# Patient Record
Sex: Male | Born: 1995 | Race: Black or African American | Hispanic: Yes | Marital: Single | Smoking: Current some day smoker
Health system: Southern US, Community
[De-identification: ages and names within clinical notes are randomized; demographics above are authoritative.]

## PROBLEM LIST (undated history)

## (undated) ENCOUNTER — Emergency Department (HOSPITAL_COMMUNITY): Admission: EM | Payer: Self-pay | Source: Home / Self Care

---
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
## 2016-11-17 ENCOUNTER — Encounter (HOSPITAL_COMMUNITY): Payer: Self-pay | Admitting: *Deleted

## 2016-11-17 ENCOUNTER — Emergency Department (HOSPITAL_COMMUNITY): Payer: Self-pay

## 2016-11-17 ENCOUNTER — Emergency Department (HOSPITAL_COMMUNITY)
Admission: EM | Admit: 2016-11-17 | Discharge: 2016-11-17 | Disposition: A | Discharge status: Home / Self Care | Payer: Self-pay | Attending: Emergency Medicine | Admitting: Emergency Medicine

## 2016-11-17 DIAGNOSIS — N50812 Left testicular pain: Secondary | ICD-10-CM | POA: Insufficient documentation

## 2016-11-17 DIAGNOSIS — N50811 Right testicular pain: Secondary | ICD-10-CM | POA: Insufficient documentation

## 2016-11-17 DIAGNOSIS — F17210 Nicotine dependence, cigarettes, uncomplicated: Secondary | ICD-10-CM | POA: Insufficient documentation

## 2016-11-17 DIAGNOSIS — N50819 Testicular pain, unspecified: Secondary | ICD-10-CM

## 2016-11-17 DIAGNOSIS — Z79899 Other long term (current) drug therapy: Secondary | ICD-10-CM | POA: Insufficient documentation

## 2016-11-17 DIAGNOSIS — Z791 Long term (current) use of non-steroidal anti-inflammatories (NSAID): Secondary | ICD-10-CM | POA: Insufficient documentation

## 2016-11-17 LAB — URINALYSIS, ROUTINE W REFLEX MICROSCOPIC
Bilirubin Urine: NEGATIVE
Glucose, UA: NEGATIVE mg/dL
Hgb urine dipstick: NEGATIVE
Ketones, ur: NEGATIVE mg/dL
Leukocytes, UA: NEGATIVE
Nitrite: NEGATIVE
Protein, ur: 30 mg/dL — AB
Specific Gravity, Urine: 1.025 (ref 1.005–1.030)
Squamous Epithelial / LPF: NONE SEEN
pH: 8 (ref 5.0–8.0)

## 2016-11-17 LAB — CBC
HCT: 42 % (ref 39.0–52.0)
Hemoglobin: 14.2 g/dL (ref 13.0–17.0)
MCH: 28.4 pg (ref 26.0–34.0)
MCHC: 33.8 g/dL (ref 30.0–36.0)
MCV: 84 fL (ref 78.0–100.0)
Platelets: 262 10*3/uL (ref 150–400)
RBC: 5 MIL/uL (ref 4.22–5.81)
RDW: 12.6 % (ref 11.5–15.5)
WBC: 4.4 10*3/uL (ref 4.0–10.5)

## 2016-11-17 LAB — COMPREHENSIVE METABOLIC PANEL
ALT: 16 U/L — ABNORMAL LOW (ref 17–63)
AST: 21 U/L (ref 15–41)
Albumin: 4.5 g/dL (ref 3.5–5.0)
Alkaline Phosphatase: 59 U/L (ref 38–126)
Anion gap: 7 (ref 5–15)
BUN: 10 mg/dL (ref 6–20)
CO2: 26 mmol/L (ref 22–32)
Calcium: 9.6 mg/dL (ref 8.9–10.3)
Chloride: 106 mmol/L (ref 101–111)
Creatinine, Ser: 0.85 mg/dL (ref 0.61–1.24)
GFR calc Af Amer: >60 mL/min (ref >60)
GFR calc non Af Amer: >60 mL/min (ref >60)
Glucose, Bld: 96 mg/dL (ref 65–99)
Potassium: 4 mmol/L (ref 3.5–5.1)
Sodium: 139 mmol/L (ref 135–145)
Total Bilirubin: 1.2 mg/dL (ref 0.3–1.2)
Total Protein: 7.8 g/dL (ref 6.5–8.1)

## 2016-11-17 LAB — LIPASE, BLOOD: Lipase: 29 U/L (ref 11–51)

## 2016-11-17 NOTE — ED Notes (Signed)
Patient visualized leaving ER by this RN- pt sts unable to wait and has to leave now. PA made aware

## 2016-11-17 NOTE — ED Notes (Signed)
Pt to US.

## 2016-11-17 NOTE — ED Triage Notes (Signed)
Pt sates intermittent testicular pain, back pain and abdominal pain x 1 week.  States 2 days this week his urine was "purple/red) and the pain was so severe that he vomited.

## 2016-11-17 NOTE — ED Provider Notes (Signed)
[facility]EM[facility]Y DEPT Provider Note   CSN: [id number] Arrival date & time: 11/17/16  1141     History   Chief Complaint Chief Complaint  Patient presents with  . Testicle Pain  . Abdominal Pain    HPI Ladarius Seubert is a 21 y.o. male.  HPI   21 year old male presents today with complaints of testicular pain.  Patient notes that over the last year he has had on and off bladder and testicular pain.  He notes the symptoms come in waves with intense pain over the bladder radiating down into the bilateral testicles.  He notes these last several hours and then resolve on their own without intervention.  Patient notes most recently he had symptoms 6 days ago.  He notes the pain of the bladder with radiation of the testicles, he had dark bloody urine.  Symptoms lasted through the evening and then resolved on their own.  Patient denies any swelling or edema over the testicles or abdomen.  He reports the abdomen and pelvis are nontender to palpation.  He denies any fever or infectious etiology.  Patient notes that he has not had any significant penile discharge or dysuria.  Patient denies any other complaints today.  No significant past medical history.      History reviewed. No pertinent past medical history.  There are no active problems to display for this patient.   History reviewed. No pertinent surgical history.     Home Medications    Prior to Admission medications   Medication Sig Start Date End Date Taking? Authorizing Provider  ibuprofen (ADVIL,MOTRIN) 400 MG tablet Take 1 tablet (400 mg total) by mouth every 6 (six) hours as needed. 05/11/13   Marcellina Millin, MD  mupirocin cream (BACTROBAN) 2 % Apply 1 application topically 2 (two) times daily. X 7 days qs 05/11/13   Marcellina Millin, MD    Family History No family history on file.  Social History Social History  Substance Use Topics  . Smoking status: Current Some Day Smoker  . Smokeless tobacco: Never Used  . Alcohol  use Yes     Comment: occ     Allergies   Patient has no known allergies.   Review of Systems Review of Systems  All other systems reviewed and are negative.    Physical Exam Updated Vital Signs BP 115/82 (BP Location: Left Arm)   Pulse 67   Temp 98.6 F (37 C) (Oral)   Resp 16   Ht 5\' 5"  (1.651 m)   Wt 54.9 kg (121 lb)   SpO2 100%   BMI 20.14 kg/m   Physical Exam  Constitutional: He is oriented to person, place, and time. He appears well-developed and well-nourished.  HENT:  Head: Normocephalic and atraumatic.  Eyes: Pupils are equal, round, and reactive to light. Conjunctivae are normal. Right eye exhibits no discharge. Left eye exhibits no discharge. No scleral icterus.  Neck: Normal range of motion. No JVD present. No tracheal deviation present.  Pulmonary/Chest: Effort normal. No stridor.  Abdominal:  Abdomen nontender to palpation  Genitourinary:  Genitourinary Comments: Bilateral descended testes nontender to palpation no rashes noted  Neurological: He is alert and oriented to person, place, and time. Coordination normal.  Psychiatric: He has a normal mood and affect. His behavior is normal. Judgment and thought content normal.  Nursing note and vitals reviewed.    ED Treatments / Results  Labs (all labs ordered are listed, but only abnormal results are displayed) Labs Reviewed  COMPREHENSIVE METABOLIC  PANEL - Abnormal; Notable for the following:       Result Value   ALT 16 (*)    All other components within normal limits  URINALYSIS, ROUTINE W REFLEX MICROSCOPIC - Abnormal; Notable for the following:    Protein, ur 30 (*)    Bacteria, UA FEW (*)    All other components within normal limits  LIPASE, BLOOD  CBC  GC/CHLAMYDIA PROBE AMP () [facility]    EKG  EKG Interpretation None       Radiology [place] Scrotum  Result Date: 11/17/2016 CLINICAL DATA:  Testicular pain. Hematuria last Thursday. Pain for 1 year. EXAM: SCROTAL ULTRASOUND  DOPPLER ULTRASOUND OF THE TESTICLES TECHNIQUE: Complete ultrasound examination of the testicles, epididymis, and other scrotal structures was performed. Color and spectral Doppler ultrasound were also utilized to evaluate blood flow to the testicles. COMPARISON:  None. FINDINGS: Right testicle Measurements: 36 x 18 x 27 mm. No mass or microlithiasis visualized. Left testicle Measurements: 44 x 20 x 28 mm. Negative for mass or inflammation. Microcalcifications with up to 5 calcifications seen on an individual image. Right epididymis:  Normal in size and appearance. Left epididymis:  Normal in size and appearance. Hydrocele:  None visualized. Varicocele:  None visualized. Pulsed Doppler interrogation of both testes demonstrates normal low resistance arterial and venous waveforms bilaterally. IMPRESSION: 1. No acute finding. 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 Electronically Signed   By: Marnee SpringJonathon  Watts M.D.   On: 11/17/2016 16:14   [place] Art/ven Flow Abd Pelv Doppler  Result Date: 11/17/2016 CLINICAL DATA:  Testicular pain. Hematuria last Thursday. Pain for 1 year. EXAM: SCROTAL ULTRASOUND DOPPLER ULTRASOUND OF THE TESTICLES TECHNIQUE: Complete ultrasound examination of the testicles, epididymis, and other scrotal structures was performed. Color and spectral Doppler ultrasound were also utilized to evaluate blood flow to the testicles. COMPARISON:  None. FINDINGS: Right testicle Measurements: 36 x 18 x 27 mm. No mass or microlithiasis visualized. Left testicle Measurements: 44 x 20 x 28 mm. Negative for mass or inflammation. Microcalcifications with up to 5 calcifications seen on an individual image. Right epididymis:  Normal in size and appearance. Left epididymis:  Normal in size and appearance. Hydrocele:  None visualized. Varicocele:  None visualized. Pulsed Doppler interrogation of both testes demonstrates normal low resistance arterial and venous waveforms bilaterally. IMPRESSION: 1. No acute finding. 2.  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 Electronically Signed   By: Marnee SpringJonathon  Watts M.D.   On: 11/17/2016 16:14    Procedures Procedures (including critical care time)  Medications Ordered in ED Medications - No data to display   Initial Impression / Assessment and Plan / ED Course  I have reviewed the triage vital signs and the nursing notes.  Pertinent labs & imaging results that were available during my care of the patient were reviewed by me and considered in my medical decision making (see chart for details).     Final Clinical Impressions(s) / ED Diagnoses   Final diagnoses:  Testicular pain   Labs: Urinalysis, lipase, CMP, CBC  Imaging: Ultrasound scrotum  Consults:  Therapeutics:  Discharge Meds:   Assessment/Plan: Presents with on and off bladder pain with radiation to his testicles.  Patient has no signs of infectious etiology.  I have very low suspicion for testicular torsion in this patient.  He is asymptomatic now.  His laboratory analysis is unrevealing.  A question if patient is having kidney stones as he did some blood in his urine.  Patient will have  testicular ultrasound here.  After patient getting ultrasound he reported that he would be leaving as he had a good work.  I did not have the results of the ultrasound, I also recommended that he stay for discharge paperwork so that I could refer him to urology.  Patient left prior to discharge paperwork or results of the ultrasound.  Chart review shows that ultrasound shows no significant findings.      New Prescriptions Discharge Medication List as of 11/17/2016  4:16 PM       Rosalio Loud 11/17/16 1939    Raeford Razor, MD 11/27/16 1310

## 2016-11-18 LAB — GC/CHLAMYDIA PROBE AMP (~~LOC~~) NOT AT ARMC
Chlamydia: NEGATIVE
Neisseria Gonorrhea: NEGATIVE

## 2017-01-17 ENCOUNTER — Emergency Department (HOSPITAL_COMMUNITY)
Admission: EM | Admit: 2017-01-17 | Discharge: 2017-01-17 | Disposition: A | Discharge status: Home / Self Care | Payer: Self-pay | Attending: Emergency Medicine | Admitting: Emergency Medicine

## 2017-01-17 ENCOUNTER — Encounter (HOSPITAL_COMMUNITY): Payer: Self-pay | Admitting: *Deleted

## 2017-01-17 DIAGNOSIS — K130 Diseases of lips: Secondary | ICD-10-CM | POA: Insufficient documentation

## 2017-01-17 DIAGNOSIS — L03211 Cellulitis of face: Secondary | ICD-10-CM

## 2017-01-17 DIAGNOSIS — R220 Localized swelling, mass and lump, head: Secondary | ICD-10-CM

## 2017-01-17 DIAGNOSIS — F17200 Nicotine dependence, unspecified, uncomplicated: Secondary | ICD-10-CM | POA: Insufficient documentation

## 2017-01-17 MED ORDER — CLINDAMYCIN HCL 150 MG PO CAPS
450.0000 mg | ORAL_CAPSULE | Freq: Once | ORAL | Status: AC
  Administered 2017-01-17: 450 mg via ORAL
  Filled 2017-01-17: qty 3

## 2017-01-17 MED ORDER — CLINDAMYCIN HCL 150 MG PO CAPS: 450.0000 mg | ORAL_CAPSULE | Freq: Three times a day (TID) | ORAL | 0 refills | Status: AC

## 2017-01-17 MED ORDER — TETANUS-DIPHTH-ACELL PERTUSSIS 5-2.5-18.5 LF-MCG/0.5 IM SUSP
0.5000 mL | Freq: Once | INTRAMUSCULAR | Status: AC
  Administered 2017-01-17: 0.5 mL via INTRAMUSCULAR
  Filled 2017-01-17: qty 0.5

## 2017-01-17 MED ORDER — PENTAFLUOROPROP-TETRAFLUOROETH EX AERO
INHALATION_SPRAY | Freq: Once | CUTANEOUS | Status: AC
  Administered 2017-01-17: 21:00:00 via TOPICAL
  Filled 2017-01-17: qty 103.5

## 2017-01-17 NOTE — ED Triage Notes (Signed)
Pt reports waking up yesterday with upper lip swelling with bumps on it. Denies injury, denies taking any prescription meds and minimal relief with benadryl pta. Denies pain. Airway intact.

## 2017-01-17 NOTE — Discharge Instructions (Signed)

## 2017-01-17 NOTE — ED Provider Notes (Signed)
[facility] Provider Note   CSN: [id number] Arrival date & time: 01/17/17  1752     History   Chief Complaint Chief Complaint  Patient presents with  . Oral Swelling    HPI Vincent Mullins is a 21 y.o. male who presents for evaluation of lip swelling.  He reports that yesterday he woke up with lip swelling and it has been gradually getting worse.  He says that shortly before he noticed the lip swelling he ate at [employer] but did not eat any other new foods.  He did take Benadryl yesterday, however it did not change his symptoms. No fevers or chills, no SOB.  He has no personal or family history of angioedema.  Does not take any medications, no ACE-I use. He has been able to eat and drink ok since this started.    He says he has two small bumps on his upper lip that when he squeezed he had pus come out.  He reports that after he attempted to squeeze this area his lip began swelling significantly.  HPI  History reviewed. No pertinent past medical history.  There are no active problems to display for this patient.   History reviewed. No pertinent surgical history.     Home Medications    Prior to Admission medications   Medication Sig Start Date End Date Taking? Authorizing Provider  clindamycin (CLEOCIN) 150 MG capsule Take 3 capsules (450 mg total) by mouth 3 (three) times daily. 01/17/17 01/24/17  Cristina Gong, PA-C  ibuprofen (ADVIL,MOTRIN) 400 MG tablet Take 1 tablet (400 mg total) by mouth every 6 (six) hours as needed. 05/11/13   Marcellina Millin, MD  mupirocin cream (BACTROBAN) 2 % Apply 1 application topically 2 (two) times daily. X 7 days qs 05/11/13   Marcellina Millin, MD    Family History History reviewed. No pertinent family history.  Social History Social History  Substance Use Topics  . Smoking status: Current Some Day Smoker  . Smokeless tobacco: Never Used  . Alcohol use Yes     Comment: occ     Allergies   Patient has no known  allergies.   Review of Systems Review of Systems  Constitutional: Negative for chills, diaphoresis and fever.  HENT: Positive for facial swelling.   Respiratory: Negative for shortness of breath.   Gastrointestinal: Negative for abdominal pain, nausea and vomiting.  Skin: Positive for wound. Negative for color change and rash.  Neurological: Negative for headaches.     Physical Exam Updated Vital Signs BP 113/66   Pulse 86   Temp 98.7 F (37.1 C) (Oral)   Resp 16   SpO2 100%   Physical Exam  Constitutional: He appears well-developed and well-nourished.  HENT:  Head: Normocephalic.  Right Ear: External ear normal.  Left Ear: External ear normal.  Nose: Nose normal.  Mouth/Throat: Oropharynx is clear and moist. No oropharyngeal exudate.  There is obvious swelling to patient's left superior lip. There are 2 small papules with mild surrounding induration.  The remainder of the lip feels generally fluctuant, highly suspicious for abscess.  Neck: Normal range of motion. Neck supple. No JVD present. No tracheal deviation present.  Cardiovascular: Normal rate.   Pulmonary/Chest: Effort normal and breath sounds normal. No stridor.  Lymphadenopathy:    He has no cervical adenopathy.  Neurological: He is alert.  Skin: Skin is warm and dry. He is not diaphoretic.  There are 2 small bumps along the lateral aspect of patient's superior left lip.  Nursing note and vitals reviewed.      ED Treatments / Results  Labs (all labs ordered are listed, but only abnormal results are displayed) Labs Reviewed - No data to display  EKG  EKG Interpretation None       Radiology No results found.  Procedures .Marland KitchenIncision and Drainage Date/Time: 01/18/2017 1:31 AM Performed by: Cristina Gong Authorized by: Cristina Gong   Consent:    Consent obtained:  Verbal   Consent given by:  Patient   Risks discussed:  Bleeding, incomplete drainage, infection, pain and damage to  other organs   Alternatives discussed:  No treatment, referral and alternative treatment Location:    Type:  Abscess   Location:  Head   Head/neck location: Left superior lip. Pre-procedure details:    Skin preparation:  Betadine Anesthesia (see MAR for exact dosages):    Anesthesia method:  Topical application   Topical anesthetic:  Benzocaine gel (Gebauers spray) Procedure type:    Complexity:  Simple Procedure details:    Needle aspiration: yes     Needle gauge: 21. Post-procedure details:    Patient tolerance of procedure:  Tolerated with difficulty Comments:     Attempted drainage of abscess in lip. Patient did not tolerate the procedure well. Patient was given the option for injection local anesthetic, and declined.  Needle aspiration was attempted, however no return of material.  Patient refused second attempt.   (including critical care time)  Medications Ordered in ED Medications  pentafluoroprop-tetrafluoroeth (GEBAUERS) aerosol ( Topical Given by Other 01/17/17 2057)  Tdap (BOOSTRIX) injection 0.5 mL (0.5 mLs Intramuscular Given 01/17/17 2052)  clindamycin (CLEOCIN) capsule 450 mg (450 mg Oral Given 01/17/17 2051)     Initial Impression / Assessment and Plan / ED Course  I have reviewed the triage vital signs and the nursing notes.  Pertinent labs & imaging results that were available during my care of the patient were reviewed by me and considered in my medical decision making (see chart for details).    Vincent Mullins presents for evaluation of swelling to his left superior lip. Based on history, papules draining pus, and worsening after he attempted to squeeze pus out of the papules I am highly suspicious that this represents an infectious process, rather than a allergic one.  He is not having any other symptoms at this time.  His upper lip is fluctuant, consistent with abscess. Needle aspiration was attempted, which patient tolerated poorly and therefore I was unable  to adequately attempt to drain the area.  Will start patient on antibiotics.    This patient was seen as a shared visit with Dr. Clarene Duke.  Patient was given strict return precautions, and stated his understanding. He was instructed to follow-up with ear nose and throat if antibiotics do not resolve his swelling.     At this time there does not appear to be any evidence of an acute emergency medical condition and the patient appears stable for discharge with appropriate outpatient follow up.Diagnosis was discussed with patient who verbalizes understanding and is agreeable to discharge.  Final Clinical Impressions(s) / ED Diagnoses   Final diagnoses:  Lip swelling  Cellulitis of face    New Prescriptions Discharge Medication List as of 01/17/2017  8:50 PM    START taking these medications   Details  clindamycin (CLEOCIN) 150 MG capsule Take 3 capsules (450 mg total) by mouth 3 (three) times daily., Starting Mon 01/17/2017, Until Mon 01/24/2017, Print  Cristina Gong, PA-C 01/18/17 4540    Little, Ambrose Finland, MD 01/20/17 [phone]

## 2017-01-17 NOTE — ED Notes (Signed)
Gebauers spray to provider

## 2017-05-30 ENCOUNTER — Other Ambulatory Visit: Payer: Self-pay

## 2017-09-18 IMAGING — US US SCROTUM
1 series · 14 of 25 positions shown · non-contrast
Comparison: None.

CLINICAL DATA: Testicular pain. Hematuria [REDACTED]. Pain for 1
year.

EXAM:
SCROTAL ULTRASOUND
DOPPLER ULTRASOUND OF THE TESTICLES
TECHNIQUE: Complete ultrasound examination of the testicles, epididymis, and
other scrotal structures was performed. Color and spectral Doppler
ultrasound were also utilized to evaluate blood flow to the
testicles.

[Series 1: us scrotum · 0.06mm/px · 14 of 62 slices shown]
[im 1/62]
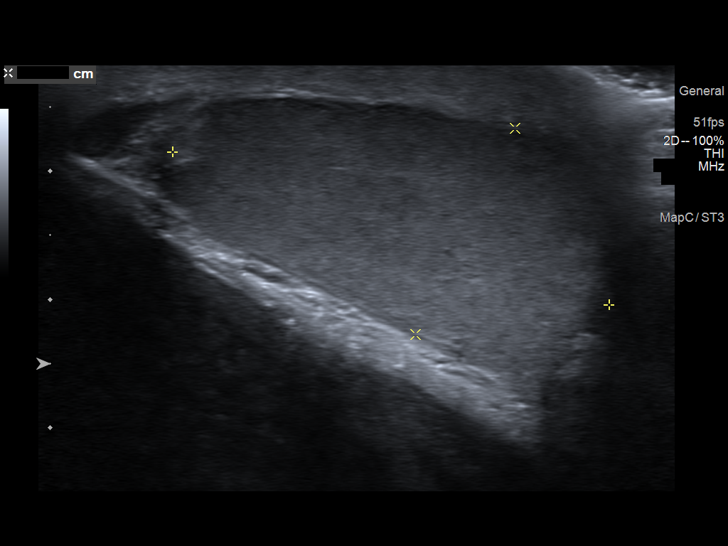
[im 6/62]
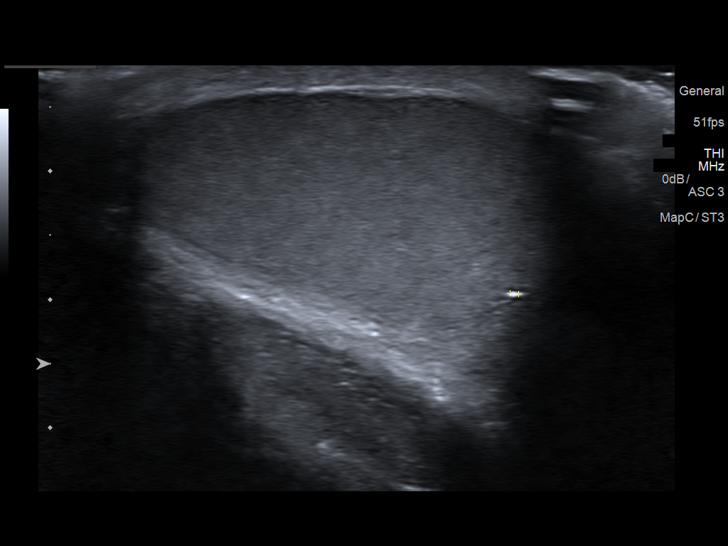
[im 11/62]
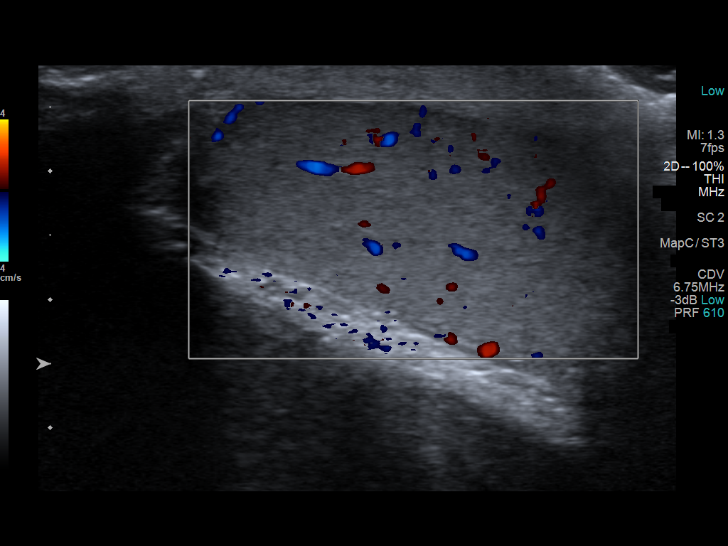
[im 16/62]
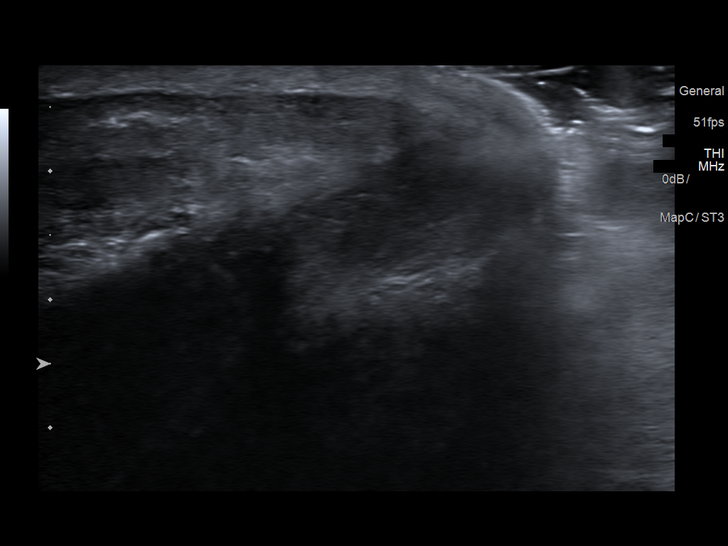
[im 21/62]
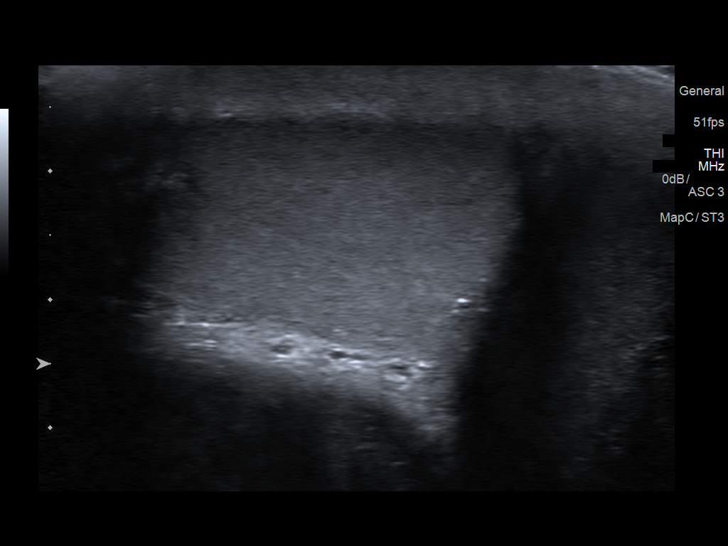
[im 23/62]
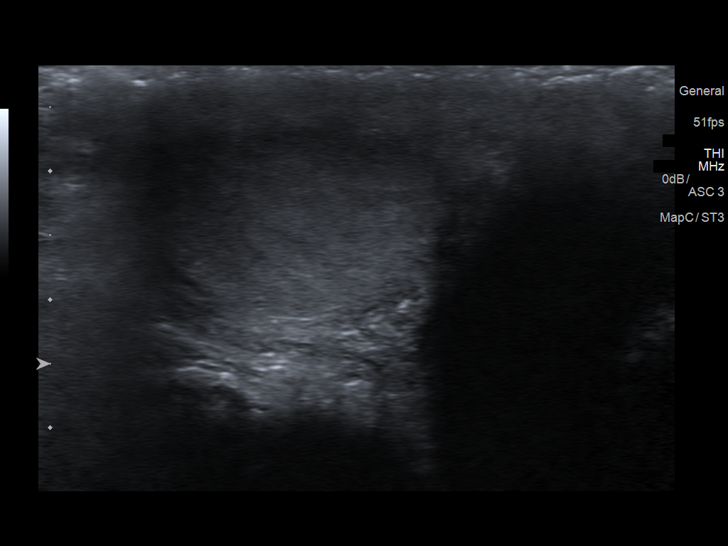
[im 28/62]
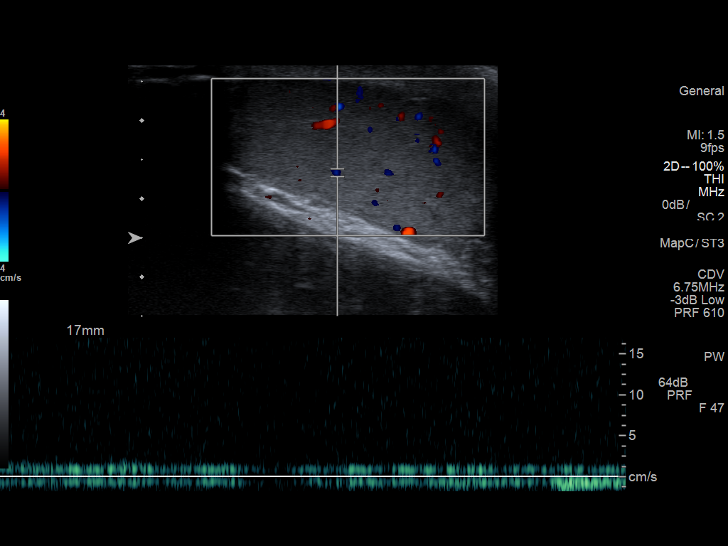
[im 34/62]
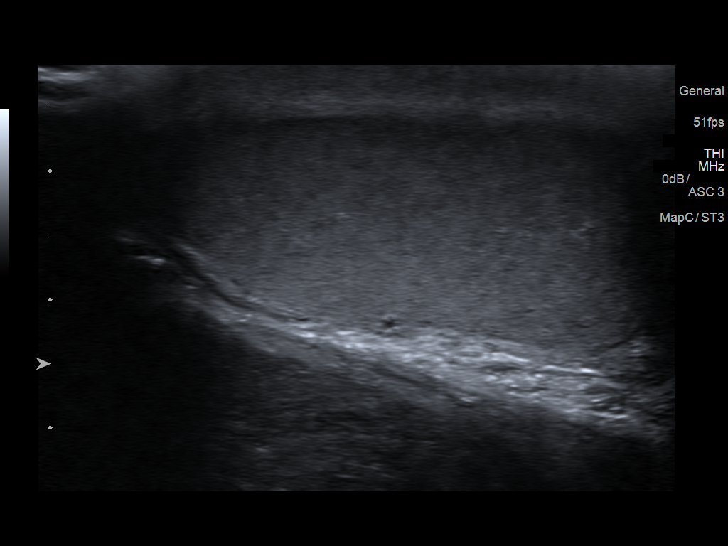
[im 39/62]
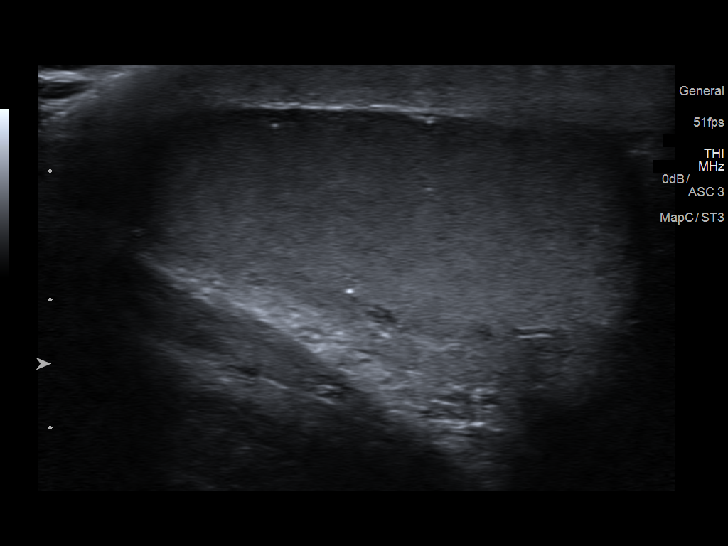
[im 41/62]
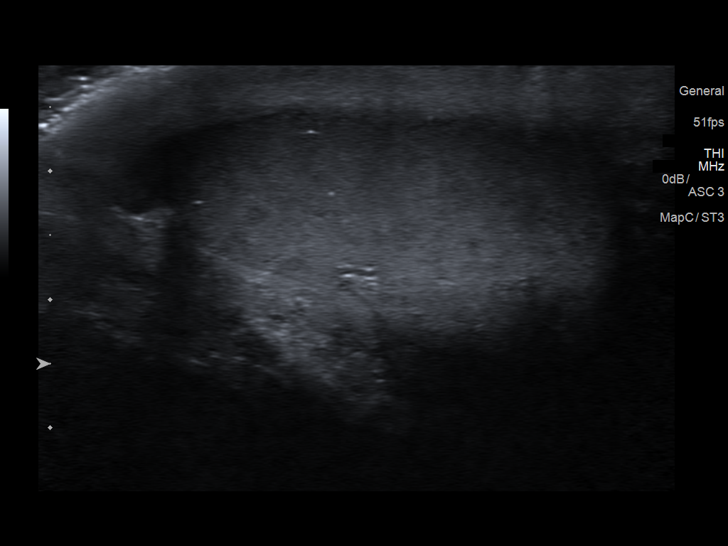
[im 46/62]
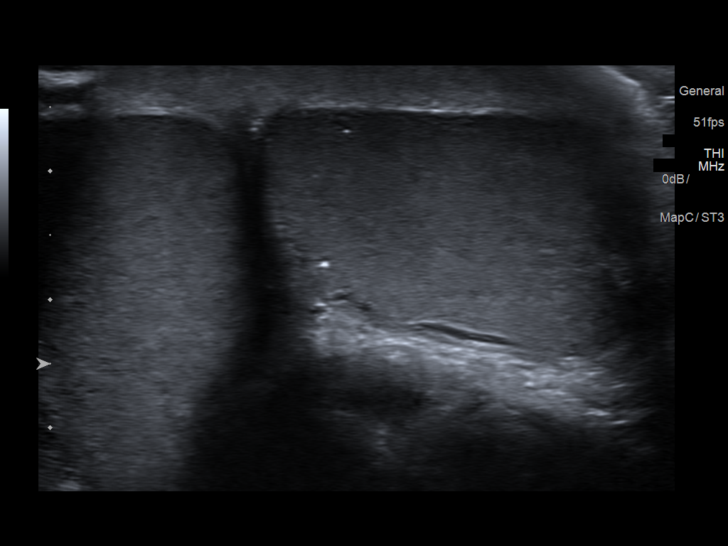
[im 51/62]
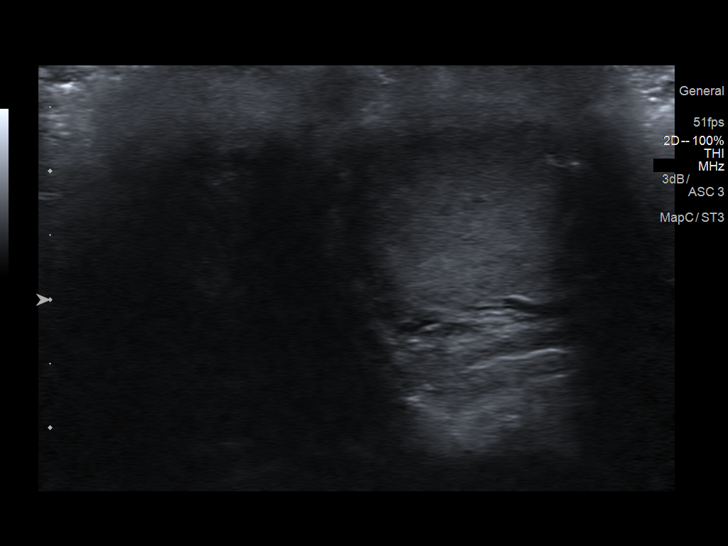
[im 56/62]
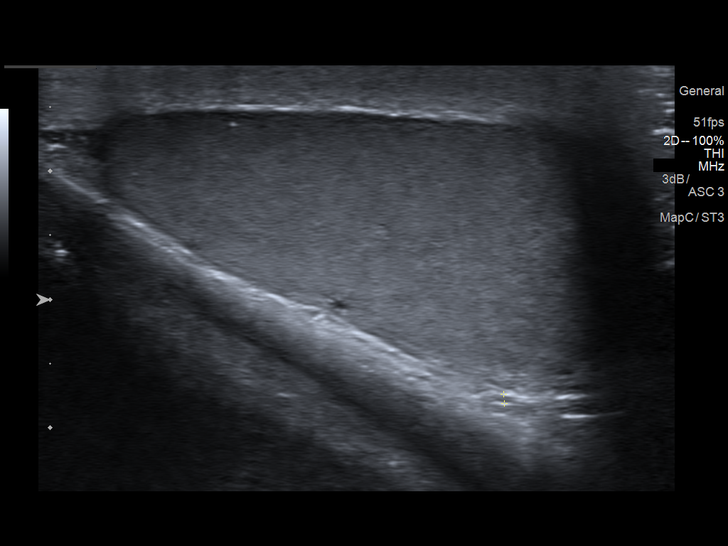
[im 62/62]
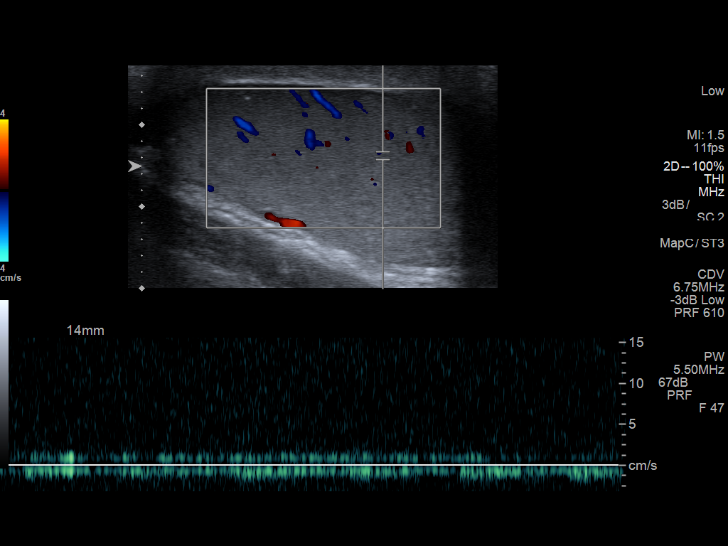

[14 of 25 positions shown; findings below may reference images not displayed]

FINDINGS: Right testicle

Measurements: 36 x 18 x 27 mm. No mass or microlithiasis visualized.

Left testicle

Measurements: 44 x 20 x 28 mm. Negative for mass or inflammation.
Microcalcifications with up to 5 calcifications seen on an
individual image.

Right epididymis:  Normal in size and appearance.

Left epididymis:  Normal in size and appearance.

Hydrocele:  None visualized.

Varicocele:  None visualized.

Pulsed Doppler interrogation of both testes demonstrates normal low
resistance arterial and venous waveforms bilaterally.
IMPRESSION: 1. No acute finding.
2. Testicular microlithiasis on the left.

## 2019-07-03 ENCOUNTER — Other Ambulatory Visit: Payer: Self-pay

## 2019-07-03 ENCOUNTER — Ambulatory Visit (HOSPITAL_COMMUNITY)
Admission: EM | Admit: 2019-07-03 | Discharge: 2019-07-03 | Disposition: A | Discharge status: Home / Self Care | Payer: HRSA Program | Attending: Physician Assistant | Admitting: Physician Assistant

## 2019-07-03 ENCOUNTER — Encounter (HOSPITAL_COMMUNITY): Payer: Self-pay

## 2019-07-03 DIAGNOSIS — R519 Headache, unspecified: Secondary | ICD-10-CM

## 2019-07-03 DIAGNOSIS — Z20822 Contact with and (suspected) exposure to covid-19: Secondary | ICD-10-CM | POA: Diagnosis present

## 2019-07-03 DIAGNOSIS — J111 Influenza due to unidentified influenza virus with other respiratory manifestations: Secondary | ICD-10-CM | POA: Diagnosis present

## 2019-07-03 DIAGNOSIS — R509 Fever, unspecified: Secondary | ICD-10-CM

## 2019-07-03 LAB — POC SARS CORONAVIRUS 2 AG: SARS Coronavirus 2 Ag: NEGATIVE

## 2019-07-03 LAB — POC SARS CORONAVIRUS 2 AG -  ED: SARS Coronavirus 2 Ag: NEGATIVE

## 2019-07-03 MED ORDER — IBUPROFEN 800 MG PO TABS
800.0000 mg | ORAL_TABLET | Freq: Once | ORAL | Status: AC
  Administered 2019-07-03: 20:00:00 800 mg via ORAL

## 2019-07-03 MED ORDER — ONDANSETRON HCL 4 MG PO TABS: 4.0000 mg | ORAL_TABLET | Freq: Three times a day (TID) | ORAL | 0 refills | Status: DC | PRN

## 2019-07-03 MED ORDER — IBUPROFEN 800 MG PO TABS
ORAL_TABLET | ORAL | Status: AC
  Filled 2019-07-03: qty 1

## 2019-07-03 MED ORDER — METOCLOPRAMIDE HCL 5 MG/ML IJ SOLN
INTRAMUSCULAR | Status: AC
  Filled 2019-07-03: qty 2

## 2019-07-03 MED ORDER — KETOROLAC TROMETHAMINE 60 MG/2ML IM SOLN
INTRAMUSCULAR | Status: AC
  Filled 2019-07-03: qty 2

## 2019-07-03 MED ORDER — METOCLOPRAMIDE HCL 5 MG/ML IJ SOLN: 5.0000 mg | Freq: Once | INTRAMUSCULAR | Status: DC

## 2019-07-03 MED ORDER — KETOROLAC TROMETHAMINE 60 MG/2ML IM SOLN: 60.0000 mg | Freq: Once | INTRAMUSCULAR | Status: DC

## 2019-07-03 NOTE — Discharge Instructions (Addendum)
I want you to utilize 2 extra strength Tylenol and 2 regular strength ibuprofen every 8 hours for your headache.  You may also take the Zofran for nausea up to every 8 hours  If your headache is severe again tomorrow I want you to go to the emergency room to be further evaluated.   If you become very lethargic or very sleepy I want you to go to the emergency department.   If your fever continues to rise I would also like for you to go to emergency department  If your Covid-19 test is positive, you will receive a phone call from [facility] regarding your results. Negative test results are not called. Both positive and negative results area always visible on MyChart. If you do not have a MyChart account, sign up instructions are in your discharge papers.   Persons who are directed to care for themselves at home may discontinue isolation under the following conditions:   At least 10 days have passed since symptom onset and  At least 24 hours have passed without running a fever (this means without the use of fever-reducing medications) and  Other symptoms have improved.  Persons infected with COVID-19 who never develop symptoms may discontinue isolation and other precautions 10 days after the date of their first positive COVID-19 test.

## 2019-07-03 NOTE — ED Provider Notes (Addendum)
[facility]    CSN: [id number] Arrival date & time: 07/03/19  1858      History   Chief Complaint Chief Complaint  Patient presents with  . Headache  . Nasal Congestion    HPI Vincent Mullins is a 24 y.o. male.   Patient presents urgent care today for complaint of headache.  He reports the headache started Saturday and has been present since.  He reports has been 10 out of 10 at times however Tylenol brings the pain down to a 7 out of 10 and is more tolerable.  He reports an episode of nosebleed on Saturday that he was able to stop.  He does report a history of having nosebleeds.  He denies a history of migraine or other headache.  He reports that lights on the street make his headache worse but denies the lights in the clinic today make his headache worse.  He has had some nausea associated with this headache but has not vomited.  Reports last week he had symptoms of cough, nasal congestion and sore throat but those mostly resolved into the weekend.  He is also had intermittent loose stools.  He is unsure whether this is related to food or sickness.  He has had chills at night and is woken up with sweats.  He denies having a fever previously and has had a temperature taken at work.  He denies skin rash.  He is also reported some sweaty palms.  Denies shortness of breath or chest pain.   His family members recently tested for Covid and were negative.  They were not sick though.   On reevaluation patient states that his headache is it is 0 out of 10 currently.  And he does state that" it comes and goes like this".      History reviewed. No pertinent past medical history.  There are no problems to display for this patient.   History reviewed. No pertinent surgical history.     Home Medications    Prior to Admission medications   Medication Sig Start Date End Date Taking? Authorizing Provider  acetaminophen (TYLENOL) 325 MG tablet Take 650 mg by mouth every 6 (six)  hours as needed.   Yes [provider]  ibuprofen (ADVIL,MOTRIN) 400 MG tablet Take 1 tablet (400 mg total) by mouth every 6 (six) hours as needed. 05/11/13  Yes Marcellina Millin, MD  mupirocin cream (BACTROBAN) 2 % Apply 1 application topically 2 (two) times daily. X 7 days qs 05/11/13   Marcellina Millin, MD  ondansetron (ZOFRAN) 4 MG tablet Take 1 tablet (4 mg total) by mouth every 8 (eight) hours as needed for nausea or vomiting. 07/03/19   Zyan Coby, Veryl Speak, PA-C    Family History Family History  Problem Relation Age of Onset  . Renal Disease Mother   . Healthy Father     Social History Social History   Tobacco Use  . Smoking status: Current Some Day Smoker  . Smokeless tobacco: Never Used  Substance Use Topics  . Alcohol use: Yes    Comment: occ  . Drug use: Yes    Types: Marijuana    Comment: occ     Allergies   Shrimp [shellfish allergy]   Review of Systems Review of Systems  Constitutional: Positive for appetite change, chills and diaphoresis. Negative for fever.  HENT: Positive for congestion and sore throat. Negative for ear pain, sinus pressure and sinus pain.   Eyes: Positive for photophobia. Negative for  pain and visual disturbance.  Respiratory: Positive for cough. Negative for shortness of breath.   Cardiovascular: Negative for chest pain and palpitations.  Gastrointestinal: Positive for diarrhea and nausea. Negative for abdominal pain and vomiting.  Genitourinary: Negative for dysuria and hematuria.  Musculoskeletal: Positive for myalgias and neck stiffness. Negative for arthralgias, back pain and neck pain.  Skin: Negative for color change and rash.  Neurological: Positive for headaches. Negative for dizziness, seizures, syncope and weakness.  All other systems reviewed and are negative.    Physical Exam Triage Vital Signs ED Triage Vitals  Enc Vitals Group     BP 07/03/19 1923 112/71     Pulse Rate 07/03/19 1923 67     Resp 07/03/19 1923 18     Temp  07/03/19 1923 100 F (37.8 C)     Temp Source 07/03/19 1923 Oral     SpO2 07/03/19 1923 100 %     Weight --      Height --      Head Circumference --      Peak Flow --      Pain Score 07/03/19 1918 8     Pain Loc --      Pain Edu? --      Excl. in GC? --    No data found.  Updated Vital Signs BP 112/71 (BP Location: Left Arm)   Pulse 67   Temp 100 F (37.8 C) (Oral)   Resp 18   SpO2 100%   Visual Acuity Right Eye Distance:   Left Eye Distance:   Bilateral Distance:    Right Eye Near:   Left Eye Near:    Bilateral Near:     Physical Exam Vitals and nursing note reviewed.  Constitutional:      General: He is not in acute distress.    Appearance: He is well-developed. He is ill-appearing.  HENT:     Head: Normocephalic and atraumatic.     Mouth/Throat:     Mouth: Mucous membranes are moist.     Pharynx: Oropharynx is clear.  Eyes:     General: No visual field deficit or scleral icterus.    Extraocular Movements: Extraocular movements intact.     Right eye: Normal extraocular motion.     Left eye: Normal extraocular motion.     Conjunctiva/sclera: Conjunctivae normal.     Pupils: Pupils are equal, round, and reactive to light. Pupils are equal.  Neck:     Comments: Patient reports some neck stiffness with chin to chest.  When patient was placed in supine he reported severe headache with the position change.  He also reports headache got worse when he bent his neck forward with chin to chest.  Of note after returning to room following physical exam patient reports no headache at this time. Cardiovascular:     Rate and Rhythm: Normal rate and regular rhythm.     Heart sounds: No murmur.  Pulmonary:     Effort: Pulmonary effort is normal. No respiratory distress.     Breath sounds: Normal breath sounds.  Abdominal:     Palpations: Abdomen is soft.     Tenderness: There is no abdominal tenderness.  Musculoskeletal:     Cervical back: Neck supple. No rigidity.    Lymphadenopathy:     Cervical: No cervical adenopathy.  Skin:    General: Skin is warm and dry.     Capillary Refill: Capillary refill takes less than 2 seconds.     Findings:  No rash.  Neurological:     Mental Status: He is alert and oriented to person, place, and time.     Cranial Nerves: No cranial nerve deficit.     Sensory: No sensory deficit.     Motor: No weakness.     Deep Tendon Reflexes: Reflexes normal.  Psychiatric:        Mood and Affect: Mood normal.        Speech: Speech normal.        Behavior: Behavior normal.      UC Treatments / Results  Labs (all labs ordered are listed, but only abnormal results are displayed) Labs Reviewed  NOVEL CORONAVIRUS, NAA (HOSP ORDER, SEND-OUT TO REF LAB; TAT 18-24 HRS)  POC SARS CORONAVIRUS 2 AG -  ED  POC SARS CORONAVIRUS 2 AG    EKG   Radiology No results found.  Procedures Procedures (including critical care time)  Medications Ordered in UC Medications  ibuprofen (ADVIL) tablet 800 mg (800 mg Oral Given 07/03/19 2015)    Initial Impression / Assessment and Plan / UC Course  I have reviewed the triage vital signs and the nursing notes.  Pertinent labs & imaging results that were available during my care of the patient were reviewed by me and considered in my medical decision making (see chart for details).     #Influenza-like illness Patient is 24 year old male presenting with acute onset of headache with fever and chills.  Given his clinical history there is some concern whether this may be Covid versus influenza.  Rapid Covid was negative in clinic Covid PCR was sent.  Patient is not had known sick contacts and family members have tested negative for Covid.  No influenza testing was conducted as he is outside the window for appropriate treatment.  Headache let off during clinic, and was given 800 mg ibuprofen prior to discharge.  There was some initial concern of meningitis been given, complete resolution of  headache this is less likely.  Instructed patient that if he has sudden return of headache similar to this I would like for him to get emergency department for evaluation. -Emergency department precautions were discussed patient understands and is amenable to plan -Zofran was sent for nausea related to headaches  Final Clinical Impressions(s) / UC Diagnoses   Final diagnoses:  Influenza-like illness  Encounter for laboratory testing for COVID-19 virus     Discharge Instructions     I want you to utilize 2 extra strength Tylenol and 2 regular strength ibuprofen every 8 hours for your headache.  You may also take the Zofran for nausea up to every 8 hours  If your headache is severe again tomorrow I want you to go to the emergency room to be further evaluated.   If you become very lethargic or very sleepy I want you to go to the emergency department.   If your fever continues to rise I would also like for you to go to emergency department  If your Covid-19 test is positive, you will receive a phone call from [facility] regarding your results. Negative test results are not called. Both positive and negative results area always visible on MyChart. If you do not have a MyChart account, sign up instructions are in your discharge papers.   Persons who are directed to care for themselves at home may discontinue isolation under the following conditions:  . At least 10 days have passed since symptom onset and . At least 24 hours have passed  without running a fever (this means without the use of fever-reducing medications) and . Other symptoms have improved.  Persons infected with COVID-19 who never develop symptoms may discontinue isolation and other precautions 10 days after the date of their first positive COVID-19 test.     ED Prescriptions    Medication Sig Dispense Auth. Provider   ondansetron (ZOFRAN) 4 MG tablet  (Status: Discontinued) Take 1 tablet (4 mg total) by mouth every 8  (eight) hours as needed for nausea or vomiting. 4 tablet Deo Mehringer, Veryl Speak, PA-C   ondansetron (ZOFRAN) 4 MG tablet Take 1 tablet (4 mg total) by mouth every 8 (eight) hours as needed for nausea or vomiting. 4 tablet Neddie Steedman, Veryl Speak, PA-C     PDMP not reviewed this encounter.   Hermelinda Medicus, PA-C 07/03/19 2031    Adilyn Humes, Veryl Speak, PA-C 07/03/19 2032

## 2019-07-03 NOTE — ED Triage Notes (Signed)
Pt c/o HA, sinus congestion, general weakness, abd pain, diarrhea, and chills since Saturday since Saturday. Also reports nausea today, but resolved at present.  Reports had sore throat, cough last week, which resolved.  Took 1000mg  tylenol at approx 1800 today.

## 2019-07-04 ENCOUNTER — Emergency Department (HOSPITAL_COMMUNITY)
Admission: EM | Admit: 2019-07-04 | Discharge: 2019-07-04 | Disposition: A | Discharge status: Home / Self Care | Payer: Self-pay | Attending: Emergency Medicine | Admitting: Emergency Medicine

## 2019-07-04 ENCOUNTER — Other Ambulatory Visit: Payer: Self-pay

## 2019-07-04 ENCOUNTER — Encounter (HOSPITAL_COMMUNITY): Payer: Self-pay

## 2019-07-04 DIAGNOSIS — R519 Headache, unspecified: Secondary | ICD-10-CM | POA: Insufficient documentation

## 2019-07-04 DIAGNOSIS — Z5321 Procedure and treatment not carried out due to patient leaving prior to being seen by health care provider: Secondary | ICD-10-CM | POA: Insufficient documentation

## 2019-07-04 NOTE — ED Notes (Signed)
Called pt x2, no response. 

## 2019-07-04 NOTE — ED Triage Notes (Signed)
Pt reports headache for the past week, seen at [facility] yesterday, had negative COVID test. Sent here for further eval of headache. Pt a.o, nad noted

## 2019-07-05 LAB — NOVEL CORONAVIRUS, NAA (HOSP ORDER, SEND-OUT TO REF LAB; TAT 18-24 HRS): SARS-CoV-2, NAA: NOT DETECTED

## 2019-07-06 ENCOUNTER — Other Ambulatory Visit: Payer: Self-pay

## 2019-07-06 ENCOUNTER — Emergency Department (HOSPITAL_COMMUNITY)
Admission: EM | Admit: 2019-07-06 | Discharge: 2019-07-06 | Disposition: A | Discharge status: Home / Self Care | Payer: Self-pay | Attending: Emergency Medicine | Admitting: Emergency Medicine

## 2019-07-06 ENCOUNTER — Encounter (HOSPITAL_COMMUNITY): Payer: Self-pay | Admitting: *Deleted

## 2019-07-06 DIAGNOSIS — Z720 Tobacco use: Secondary | ICD-10-CM | POA: Insufficient documentation

## 2019-07-06 DIAGNOSIS — R519 Headache, unspecified: Secondary | ICD-10-CM

## 2019-07-06 NOTE — Discharge Instructions (Addendum)
Schedule to have your vision checked

## 2019-07-06 NOTE — ED Provider Notes (Signed)
[facility] EMERGENCY DEPARTMENT Provider Note   CSN: [id number] Arrival date & time: 07/06/19  6546     History Chief Complaint  Patient presents with  . Headache    Vincent Mullins is a 24 y.o. male.  The history is provided by the patient. No language interpreter was used.  Headache Pain location:  Generalized Quality:  Unable to specify Radiates to:  Does not radiate Onset quality:  Gradual Duration:  5 days Chronicity:  New Similar to prior headaches: yes   Relieved by:  NSAIDs Worsened by:  Nothing Ineffective treatments:  None tried Associated symptoms: ear pain   Associated symptoms: no neck stiffness   Risk factors: no anger and no family hx of SAH    Vision seems blurry at night under street lights    History reviewed. No pertinent past medical history.  There are no problems to display for this patient.   History reviewed. No pertinent surgical history.     Family History  Problem Relation Age of Onset  . Renal Disease Mother   . Healthy Father     Social History   Tobacco Use  . Smoking status: Current Some Day Smoker  . Smokeless tobacco: Never Used  Substance Use Topics  . Alcohol use: Yes    Comment: occ  . Drug use: Yes    Types: Marijuana    Comment: occ    Home Medications Prior to Admission medications   Medication Sig Start Date End Date Taking? Authorizing Provider  acetaminophen (TYLENOL) 325 MG tablet Take 650 mg by mouth every 6 (six) hours as needed.    [provider]  ibuprofen (ADVIL,MOTRIN) 400 MG tablet Take 1 tablet (400 mg total) by mouth every 6 (six) hours as needed. 05/11/13   Marcellina Millin, MD  mupirocin cream (BACTROBAN) 2 % Apply 1 application topically 2 (two) times daily. X 7 days qs 05/11/13   Marcellina Millin, MD  ondansetron (ZOFRAN) 4 MG tablet Take 1 tablet (4 mg total) by mouth every 8 (eight) hours as needed for nausea or vomiting. 07/03/19   Darr, Veryl Speak, PA-C    Allergies      Shrimp [shellfish allergy]  Review of Systems   Review of Systems  HENT: Positive for ear pain.   Musculoskeletal: Negative for neck stiffness.  Neurological: Positive for headaches.  All other systems reviewed and are negative.   Physical Exam Updated Vital Signs BP 110/66 (BP Location: Right Arm)   Pulse (!) 55   Temp 98 F (36.7 C) (Oral)   Resp 16   Ht 5\' 6"  (1.676 m)   Wt 59 kg   SpO2 100%   BMI 20.98 kg/m   Physical Exam Vitals and nursing note reviewed.  Constitutional:      Appearance: He is well-developed.  HENT:     Head: Normocephalic and atraumatic.  Eyes:     Conjunctiva/sclera: Conjunctivae normal.  Cardiovascular:     Rate and Rhythm: Normal rate and regular rhythm.     Heart sounds: No murmur.  Pulmonary:     Effort: Pulmonary effort is normal. No respiratory distress.     Breath sounds: Normal breath sounds.  Abdominal:     Tenderness: There is no abdominal tenderness.  Musculoskeletal:     Cervical back: Neck supple.  Skin:    General: Skin is warm and dry.  Neurological:     Mental Status: He is alert and oriented to person, place, and time.  Cranial Nerves: No cranial nerve deficit.     Sensory: No sensory deficit.  Psychiatric:        Mood and Affect: Mood normal.     ED Results / Procedures / Treatments   Labs (all labs ordered are listed, but only abnormal results are displayed) Labs Reviewed - No data to display  EKG None  Radiology No results found.  Procedures Procedures (including critical care time)  Medications Ordered in ED Medications - No data to display  ED Course  I have reviewed the triage vital signs and the nursing notes.  Pertinent labs & imaging results that were available during my care of the patient were reviewed by me and considered in my medical decision making (see chart for details).    MDM Rules/Calculators/A&P                      MDM:  I doubt pathologic headache.  Pt looks well no  current headache. Pt advised he should have his vision evaluated  Final Clinical Impression(s) / ED Diagnoses Final diagnoses:  Bad headache    Rx / DC Orders ED Discharge Orders    None    An After Visit Summary was printed and given to the patient.    Fransico Meadow, [place] 07/06/19 1200    Little, Wenda Overland, MD 07/06/19 1340

## 2019-07-06 NOTE — ED Triage Notes (Signed)
C/o headache x 6 days  Better today , states his body wants him to have them checked out

## 2021-05-13 ENCOUNTER — Encounter: Payer: Self-pay | Admitting: Physician Assistant

## 2021-06-01 ENCOUNTER — Ambulatory Visit: Payer: Self-pay | Admitting: Physician Assistant

## 2022-08-05 ENCOUNTER — Telehealth: Payer: Self-pay | Admitting: Family Medicine

## 2022-08-05 DIAGNOSIS — R197 Diarrhea, unspecified: Secondary | ICD-10-CM

## 2022-08-05 DIAGNOSIS — K529 Noninfective gastroenteritis and colitis, unspecified: Secondary | ICD-10-CM

## 2022-08-05 MED ORDER — ONDANSETRON HCL 4 MG PO TABS: 4.0000 mg | ORAL_TABLET | Freq: Three times a day (TID) | ORAL | 0 refills | Status: AC | PRN

## 2022-08-05 NOTE — Patient Instructions (Addendum)
  Vincent Mullins, thank you for joining Perlie Mayo, NP for today's virtual visit.  While this provider is not your primary care provider (PCP), if your PCP is located in our provider database this encounter information will be shared with them immediately following your visit.   [facility] account gives you access to today's visit and all your visits, tests, and labs performed at [facility] " click here if you don't have a [facility] account or go to [email or web address]  Consent: (Patient) Vincent Mullins provided verbal consent for this virtual visit at the beginning of the encounter.  Current Medications:  Current Outpatient Medications:    ondansetron (ZOFRAN) 4 MG tablet, Take 1 tablet (4 mg total) by mouth every 8 (eight) hours as needed for nausea or vomiting., Disp: 15 tablet, Rfl: 0   acetaminophen (TYLENOL) 325 MG tablet, Take 650 mg by mouth every 6 (six) hours as needed., Disp: , Rfl:    ibuprofen (ADVIL,MOTRIN) 400 MG tablet, Take 1 tablet (400 mg total) by mouth every 6 (six) hours as needed., Disp: 30 tablet, Rfl: 0   mupirocin cream (BACTROBAN) 2 %, Apply 1 application topically 2 (two) times daily. X 7 days qs, Disp: 15 g, Rfl: 0   Medications ordered in this encounter:  Meds ordered this encounter  Medications   ondansetron (ZOFRAN) 4 MG tablet    Sig: Take 1 tablet (4 mg total) by mouth every 8 (eight) hours as needed for nausea or vomiting.    Dispense:  15 tablet    Refill:  0    Order Specific Question:   Supervising Provider    Answer:   Chase Picket [id number]     *If you need refills on other medications prior to your next appointment, please contact your pharmacy*  Follow-Up: Call back or seek an in-person evaluation if the symptoms worsen or if the condition fails to improve as anticipated.  [facility] [phone]  Other Instructions   -wipe surfaces down -hydrate well -rest -work  note   If you do not have a PCP, [facility] offers a free physician referral service available at [phone]. Our trained staff has the experience, knowledge and resources to put you in touch with a physician who is right for you.    If you have been instructed to have an in-person evaluation today at a local Urgent Care facility, please use the link below. It will take you to a list of all of our available [facility], including address, phone number and hours of operation. Please do not delay care.  [facility]  If you or a family member do not have a primary care provider, use the link below to schedule a visit and establish care. When you choose a [facility] primary care physician or advanced practice provider, you gain a long-term partner in health. Find a Primary Care Provider  Learn more about [facility]'s in-office and virtual care options: [facility]

## 2022-08-05 NOTE — Progress Notes (Signed)
Virtual Visit Consent   Vincent Mullins, you are scheduled for a virtual visit with a [facility] provider today. Just as with appointments in the office, your consent must be obtained to participate. Your consent will be active for this visit and any virtual visit you may have with one of our providers in the next 365 days. If you have a MyChart account, a copy of this consent can be sent to you electronically.  As this is a virtual visit, video technology does not allow for your provider to perform a traditional examination. This may limit your provider's ability to fully assess your condition. If your provider identifies any concerns that need to be evaluated in person or the need to arrange testing (such as labs, EKG, etc.), we will make arrangements to do so. Although advances in technology are sophisticated, we cannot ensure that it will always work on either your end or our end. If the connection with a video visit is poor, the visit may have to be switched to a telephone visit. With either a video or telephone visit, we are not always able to ensure that we have a secure connection.  By engaging in this virtual visit, you consent to the provision of healthcare and authorize for your insurance to be billed (if applicable) for the services provided during this visit. Depending on your insurance coverage, you may receive a charge related to this service.  I need to obtain your verbal consent now. Are you willing to proceed with your visit today? Jamell Combee has provided verbal consent on 08/05/2022 for a virtual visit (video or telephone). Perlie Mayo, NP  Date: 08/05/2022 4:02 PM  Virtual Visit via Video Note   I, Perlie Mayo, connected with  Vincent Mullins  (RN:[id number], 01/07/1996) on 08/05/22 at  4:00 PM EDT by a video-enabled telemedicine application and verified that I am speaking with the correct person using two identifiers.  Location: Patient: Virtual Visit Location Patient:  Home Provider: Virtual Visit Location Provider: Home Office   I discussed the limitations of evaluation and management by telemedicine and the availability of in person appointments. The patient expressed understanding and agreed to proceed.    History of Present Illness: Vincent Mullins is a 27 y.o. who identifies as a male who was assigned male at birth, and is being seen today for stomach distress  Onset was two days ago stomach ache and chills Associated symptoms are last night started with cramping and diarrhea, nausea- but unable to vomit. Still having diarrhea, body aches, and stomach pain Modifying factors are none- just avoiding eating heavy meals and ginger ale Denies chest pain, shortness of breath   Problems: There are no problems to display for this patient.   Allergies:  Allergies  Allergen Reactions   Shrimp [Shellfish Allergy] Swelling   Medications:  Current Outpatient Medications:    acetaminophen (TYLENOL) 325 MG tablet, Take 650 mg by mouth every 6 (six) hours as needed., Disp: , Rfl:    ibuprofen (ADVIL,MOTRIN) 400 MG tablet, Take 1 tablet (400 mg total) by mouth every 6 (six) hours as needed., Disp: 30 tablet, Rfl: 0   mupirocin cream (BACTROBAN) 2 %, Apply 1 application topically 2 (two) times daily. X 7 days qs, Disp: 15 g, Rfl: 0   ondansetron (ZOFRAN) 4 MG tablet, Take 1 tablet (4 mg total) by mouth every 8 (eight) hours as needed for nausea or vomiting., Disp: 4 tablet, Rfl: 0  Observations/Objective: Patient is well-developed, well-nourished in  no acute distress.  Resting comfortably at home.  Head is normocephalic, atraumatic.  No labored breathing. Speech is clear and coherent with logical content.  Patient is alert and oriented at baseline.   Assessment and Plan:  1. Gastroenteritis  - ondansetron (ZOFRAN) 4 MG tablet; Take 1 tablet (4 mg total) by mouth every 8 (eight) hours as needed for nausea or vomiting.  Dispense: 15 tablet; Refill: 0  2.  Diarrhea, unspecified type   -wipe surfaces down -hydrate well -rest -work note -OTC discussed as needed  Patient acknowledged agreement and understanding of the plan.       Follow Up Instructions: I discussed the assessment and treatment plan with the patient. The patient was provided an opportunity to ask questions and all were answered. The patient agreed with the plan and demonstrated an understanding of the instructions.  A copy of instructions were sent to the patient via MyChart unless otherwise noted below.     The patient was advised to call back or seek an in-person evaluation if the symptoms worsen or if the condition fails to improve as anticipated.  Time:  I spent 10 minutes with the patient via telehealth technology discussing the above problems/concerns.    Perlie Mayo, NP

## 2023-01-12 ENCOUNTER — Telehealth: Payer: Self-pay
